# Patient Record
Sex: Male | Born: 1990 | Race: Black or African American | Hispanic: No | Marital: Single | Smoking: Current every day smoker
Health system: Southern US, Community
[De-identification: ages and names within clinical notes are randomized; demographics above are authoritative.]

---

## 2011-05-19 ENCOUNTER — Inpatient Hospital Stay (INDEPENDENT_AMBULATORY_CARE_PROVIDER_SITE_OTHER)
Admission: RE | Admit: 2011-05-19 | Discharge: 2011-05-19 | Disposition: A | Discharge status: Home / Self Care | Payer: PRIVATE HEALTH INSURANCE | Source: Ambulatory Visit | Attending: Family Medicine | Admitting: Family Medicine

## 2011-05-19 ENCOUNTER — Inpatient Hospital Stay (HOSPITAL_COMMUNITY)
Admission: RE | Admit: 2011-05-19 | Discharge: 2011-05-19 | Disposition: A | Discharge status: Home / Self Care | Payer: PRIVATE HEALTH INSURANCE | Source: Ambulatory Visit | Attending: Emergency Medicine | Admitting: Emergency Medicine

## 2011-05-19 DIAGNOSIS — K5289 Other specified noninfective gastroenteritis and colitis: Secondary | ICD-10-CM

## 2011-10-17 ENCOUNTER — Ambulatory Visit: Payer: PRIVATE HEALTH INSURANCE | Admitting: Family Medicine

## 2012-06-22 ENCOUNTER — Emergency Department: Payer: Self-pay | Admitting: Emergency Medicine

## 2012-08-15 ENCOUNTER — Emergency Department (HOSPITAL_COMMUNITY)
Admission: EM | Admit: 2012-08-15 | Discharge: 2012-08-15 | Disposition: A | Discharge status: Home / Self Care | Payer: BC Managed Care – HMO | Attending: Emergency Medicine | Admitting: Emergency Medicine

## 2012-08-15 ENCOUNTER — Encounter (HOSPITAL_COMMUNITY): Payer: Self-pay | Admitting: Nurse Practitioner

## 2012-08-15 ENCOUNTER — Emergency Department (HOSPITAL_COMMUNITY): Payer: BC Managed Care – HMO

## 2012-08-15 DIAGNOSIS — R0602 Shortness of breath: Secondary | ICD-10-CM | POA: Insufficient documentation

## 2012-08-15 DIAGNOSIS — R21 Rash and other nonspecific skin eruption: Secondary | ICD-10-CM

## 2012-08-15 DIAGNOSIS — R0789 Other chest pain: Secondary | ICD-10-CM

## 2012-08-15 LAB — CBC
MCV: 84.8 fL (ref 78.0–100.0)
Platelets: 231 10*3/uL (ref 150–400)
RDW: 12.8 % (ref 11.5–15.5)
WBC: 6 10*3/uL (ref 4.0–10.5)

## 2012-08-15 LAB — BASIC METABOLIC PANEL
Chloride: 105 mEq/L (ref 96–112)
Creatinine, Ser: 1.13 mg/dL (ref 0.50–1.35)
GFR calc Af Amer: >90 mL/min (ref >90)
Potassium: 4.1 mEq/L (ref 3.5–5.1)

## 2012-08-15 LAB — POCT I-STAT TROPONIN I: Troponin i, poc: 0.02 ng/mL (ref 0.00–0.08)

## 2012-08-15 MED ORDER — NAPROXEN 500 MG PO TABS: 500.0000 mg | ORAL_TABLET | Freq: Two times a day (BID) | ORAL | Status: DC

## 2012-08-15 MED ORDER — HYDROCORTISONE 1 % EX CREA: TOPICAL_CREAM | CUTANEOUS | Status: DC

## 2012-08-15 NOTE — ED Notes (Signed)
Pt c/o chest pain over past week, notices it comes when he is anxious or "overwhelmed." NO pain now. No cardiac history. Also concerned for itchy rash over new tattoo.

## 2012-08-15 NOTE — ED Provider Notes (Signed)
History     CSN: [id number]  Arrival date & time 08/15/12  1647   First MD Initiated Contact with Patient 08/15/12 2057      Chief Complaint  Patient presents with  . Chest Pain   HPI  History provided by the patient. Patient is a 21 year old male with no significant PMH who presents with episodic left sternal chest pains. Symptoms have present for the past one to 2 weeks. Patient states symptoms seem to for most often when he is anxious, arguing feeling uneasy. Symptoms are described as heart palpitations and fast heart rate with chest discomfort and pain. Patient also reports shortness of breath with hyperventilation. Occasionally he becomes slightly lightheaded with tingling in fingers. Denies any syncopal episodes. Symptoms do not occur during exertion or activity. Patient has not used any treatments for symptoms. Patient denies any recent fever, chills, cough, hemoptysis. Patient has no recent travel. Patient denies having similar symptoms previously. Patient also complains of a for dry scaly rash over right forearm. Rash is located in the area over a tattoo. Tattoo is not new. He denies any pain.    History reviewed. No pertinent past medical history.  History reviewed. No pertinent past surgical history.  History reviewed. No pertinent family history.  History  Substance Use Topics  . Smoking status: Never Smoker   . Smokeless tobacco: Not on file  . Alcohol Use: Yes      Review of Systems  Constitutional: Negative for fever and chills.  Respiratory: Positive for shortness of breath. Negative for cough.   Cardiovascular: Positive for chest pain and palpitations. Negative for leg swelling.  Gastrointestinal: Negative for nausea, vomiting, abdominal pain, diarrhea and constipation.  Skin: Positive for rash.  Psychiatric/Behavioral: Negative for suicidal ideas. The patient is nervous/anxious.     Allergies  Review of patient's allergies indicates no known  allergies.  Home Medications  No current outpatient prescriptions on file.  BP 143/62  Pulse 75  Temp 98.6 F (37 C) (Oral)  Resp 16  SpO2 98%  Physical Exam  Nursing note and vitals reviewed. Constitutional: He is oriented to person, place, and time. He appears well-developed and well-nourished. No distress.  HENT:  Head: Normocephalic.  Cardiovascular: Normal rate and regular rhythm.   No murmur heard. Pulmonary/Chest: Effort normal and breath sounds normal. No respiratory distress. He has no wheezes. He has no rales.       Mild/moderate reproducible left sternal chest wall tenderness. No deformities or crepitus.  Abdominal: Soft. There is no tenderness. There is no rebound and no guarding.  Neurological: He is alert and oriented to person, place, and time.  Skin: Rash noted.       Dry scaly plaque-like lesions limited to areas of ink from tattoo on right forearm. Rash does not appear on other areas of the skin nearby.  Psychiatric: He has a normal mood and affect. His behavior is normal.    ED Course  Procedures  Results for orders placed during the hospital encounter of 08/15/12  CBC      Component Value Range   WBC 6.0  4.0 - 10.5 K/uL   RBC 5.61  4.22 - 5.81 MIL/uL   Hemoglobin 16.7  13.0 - 17.0 g/dL   HCT 40.9  81.1 - 91.4 %   MCV 84.8  78.0 - 100.0 fL   MCH 29.8  26.0 - 34.0 pg   MCHC 35.1  30.0 - 36.0 g/dL   RDW 78.2  95.6 - 21.3 %  Platelets 231  150 - 400 K/uL  BASIC METABOLIC PANEL      Component Value Range   Sodium 138  135 - 145 mEq/L   Potassium 4.1  3.5 - 5.1 mEq/L   Chloride 105  96 - 112 mEq/L   CO2 25  19 - 32 mEq/L   Glucose, Bld 103 (*) 70 - 99 mg/dL   BUN 18  6 - 23 mg/dL   Creatinine, Ser 9.81  0.50 - 1.35 mg/dL   Calcium 9.4  8.4 - 19.1 mg/dL   GFR calc non Af Amer >90  >90 mL/min   GFR calc Af Amer >90  >90 mL/min  POCT I-STAT TROPONIN I      Component Value Range   Troponin i, poc 0.02  0.00 - 0.08 ng/mL   Comment 3                  Dg Chest 2 View  08/15/2012  *RADIOLOGY REPORT*  Clinical Data: Chest pain and shortness of breath.  CHEST - 2 VIEW  Comparison: None.  Findings: The heart size and vascularity are normal and the lungs are clear except for slight peribronchial thickening which can be seen with bronchitis and asthma.  No effusions.  No osseous abnormality.  IMPRESSION: Slight peribronchial thickening.   Original Report Authenticated By: Gwynn Burly, M.D.      1. Musculoskeletal chest pain   2. Rash       MDM  Patient seen and evaluated. Patient appears comfortable in no acute distress.  Lab tests, EKG and chest x-ray unremarkable. Symptoms atypical for any concerning or emergent cause of symptoms. At this time patient stable to be discharged home to followup with PCP.    Date: 08/15/2012  Rate: 89  Rhythm: normal sinus rhythm  QRS Axis: normal  Intervals: normal  ST/T Wave abnormalities: normal  Conduction Disutrbances:none  Narrative Interpretation:   Old EKG Reviewed: none available       Angus Seller, [place] 08/15/12 2207

## 2012-08-15 NOTE — ED Notes (Signed)
Spoke with Kathline Magic PA, okay with seeing pt in [facility]

## 2012-08-15 NOTE — ED Provider Notes (Signed)
Medical screening examination/treatment/procedure(s) were performed by non-physician practitioner and as supervising physician I was immediately available for consultation/collaboration.  Zein Helbing, MD 08/15/12 2316 

## 2012-08-16 ENCOUNTER — Encounter (HOSPITAL_COMMUNITY): Payer: Self-pay | Admitting: *Deleted

## 2012-08-16 ENCOUNTER — Emergency Department (HOSPITAL_COMMUNITY)
Admission: EM | Admit: 2012-08-16 | Discharge: 2012-08-17 | Disposition: A | Discharge status: Home / Self Care | Payer: BC Managed Care – HMO | Attending: Emergency Medicine | Admitting: Emergency Medicine

## 2012-08-16 DIAGNOSIS — R0789 Other chest pain: Secondary | ICD-10-CM | POA: Insufficient documentation

## 2012-08-16 DIAGNOSIS — T39314A Poisoning by propionic acid derivatives, undetermined, initial encounter: Secondary | ICD-10-CM | POA: Insufficient documentation

## 2012-08-16 DIAGNOSIS — T39311A Poisoning by propionic acid derivatives, accidental (unintentional), initial encounter: Secondary | ICD-10-CM

## 2012-08-16 DIAGNOSIS — T50992A Poisoning by other drugs, medicaments and biological substances, intentional self-harm, initial encounter: Secondary | ICD-10-CM | POA: Insufficient documentation

## 2012-08-16 MED ORDER — ACTIDOSE WITH SORBITOL 50 GM/240ML PO LIQD
50.0000 g | Freq: Once | ORAL | Status: AC
  Administered 2012-08-17: 50 g via ORAL
  Filled 2012-08-16: qty 240

## 2012-08-16 NOTE — ED Notes (Addendum)
Pt was arguing with his girlfriend and took a whole bottle of naproxen at 2300.  Pt appears angry and is not answering questions except to say that his stomach hurts.  Pt filled prescription for naproxen today and entire bottle (30 tabs) of 500 mg naproxen is gone.

## 2012-08-17 LAB — RAPID URINE DRUG SCREEN, HOSP PERFORMED
Amphetamines: NOT DETECTED
Cocaine: NOT DETECTED
Opiates: NOT DETECTED
Tetrahydrocannabinol: POSITIVE — AB

## 2012-08-17 LAB — COMPREHENSIVE METABOLIC PANEL
ALT: 25 U/L (ref 0–53)
AST: 25 U/L (ref 0–37)
Albumin: 4.4 g/dL (ref 3.5–5.2)
CO2: 27 mEq/L (ref 19–32)
Calcium: 9.3 mg/dL (ref 8.4–10.5)
GFR calc non Af Amer: 80 mL/min — ABNORMAL LOW (ref >90)
Sodium: 140 mEq/L (ref 135–145)
Total Protein: 7 g/dL (ref 6.0–8.3)

## 2012-08-17 LAB — CBC
MCH: 29.5 pg (ref 26.0–34.0)
Platelets: 212 10*3/uL (ref 150–400)
RBC: 5.33 MIL/uL (ref 4.22–5.81)

## 2012-08-17 LAB — SALICYLATE LEVEL: Salicylate Lvl: <2 mg/dL — ABNORMAL LOW (ref 2.8–20.0)

## 2012-08-17 MED ORDER — SODIUM CHLORIDE 0.9 % IV SOLN: 1000.0000 mL | INTRAVENOUS | Status: DC

## 2012-08-17 MED ORDER — SODIUM CHLORIDE 0.9 % IV SOLN
1000.0000 mL | Freq: Once | INTRAVENOUS | Status: AC
  Administered 2012-08-17: 1000 mL via INTRAVENOUS

## 2012-08-17 NOTE — ED Notes (Signed)
Pt ambulatory to bathroom

## 2012-08-17 NOTE — ED Provider Notes (Addendum)
History     CSN: [id number]  Arrival date & time 08/16/12  2326   First MD Initiated Contact with Patient 08/16/12 2329      Chief Complaint  Patient presents with  . Drug Overdose    (Consider location/radiation/quality/duration/timing/severity/associated sxs/prior treatment) HPI 21 yo male presents to the ER with reported overdose.  Pt seen in ED yesterday with chest pain, prescribed naprosyn, 30 tablets.  Pt had reported fight with girlfriend, took entire bottle around 11 pm.  Pt refusing to answer questions to triage, aside from c/o stomach pain.  He reports to me that he only took 6-9 tablets "because my chest hurt" and "spit out the rest when they all spilled in my mouth", but then reports the rest of the bottle spilled on the floor.  Bottle is empty.  No prior h/o psychiatric problems in medical record.  Pt denies co-ingestion.  Pt denies abd pain to me.  Pt reports he took more than prescribed dosage due to not reading the instructions and having "a lot of pain".  Pt with flat affect, will not look at me, and his story changes often on how many pills he took and what happened to 30 tabs.    History reviewed. No pertinent past medical history.  History reviewed. No pertinent past surgical history.  No family history on file.  History  Substance Use Topics  . Smoking status: Never Smoker   . Smokeless tobacco: Not on file  . Alcohol Use: Yes     occasionally      Review of Systems  Unable to perform ROS: Other  Pt refuses to answer ROS questions  Allergies  Review of patient's allergies indicates no known allergies.  Home Medications   Current Outpatient Rx  Name Route Sig Dispense Refill  . NAPROXEN 500 MG PO TABS Oral Take by mouth once.      BP 140/74  Pulse 106  Temp 99.2 F (37.3 C) (Oral)  Resp 20  SpO2 99%  Physical Exam  Nursing note and vitals reviewed. Constitutional: He is oriented to person, place, and time. He appears well-developed and  well-nourished.  HENT:  Head: Normocephalic and atraumatic.  Nose: Nose normal.  Mouth/Throat: Oropharynx is clear and moist.  Eyes: Conjunctivae normal and EOM are normal. Pupils are equal, round, and reactive to light.  Neck: Normal range of motion. Neck supple. No JVD present. No tracheal deviation present. No thyromegaly present.  Cardiovascular: Normal rate, regular rhythm, normal heart sounds and intact distal pulses.  Exam reveals no gallop and no friction rub.   No murmur heard. Pulmonary/Chest: Effort normal and breath sounds normal. No stridor. No respiratory distress. He has no wheezes. He has no rales. He exhibits no tenderness.  Abdominal: Soft. Bowel sounds are normal. He exhibits no distension and no mass. There is no tenderness. There is no rebound and no guarding.  Musculoskeletal: Normal range of motion. He exhibits no edema and no tenderness.  Lymphadenopathy:    He has no cervical adenopathy.  Neurological: He is alert and oriented to person, place, and time. He exhibits normal muscle tone. Coordination normal.  Skin: Skin is warm and dry. No rash noted. No erythema. No pallor.  Psychiatric:       Flat affect, angry, hostile    ED Course  Procedures (including critical care time)   Labs Reviewed  CBC  COMPREHENSIVE METABOLIC PANEL  ETHANOL  ACETAMINOPHEN LEVEL  SALICYLATE LEVEL  URINE RAPID DRUG SCREEN (HOSP PERFORMED)  Dg Chest 2 View  08/15/2012  *RADIOLOGY REPORT*  Clinical Data: Chest pain and shortness of breath.  CHEST - 2 VIEW  Comparison: None.  Findings: The heart size and vascularity are normal and the lungs are clear except for slight peribronchial thickening which can be seen with bronchitis and asthma.  No effusions.  No osseous abnormality.  IMPRESSION: Slight peribronchial thickening.   Original Report Authenticated By: Gwynn Burly, M.D.     Date: 08/17/2012  Rate: 90  Rhythm: normal sinus rhythm  QRS Axis: normal  Intervals: normal   ST/T Wave abnormalities: normal  Conduction Disutrbances:none  Narrative Interpretation:   Old EKG Reviewed: unchanged     1. Naproxen overdose       MDM  21 year old male with reported overdose of 30 tablets of Naprosyn. Patient has alternating story of how money he took and why he took them, and is not reliable historian. Concern for possible suicide attempt or suicide gesture given context of having a fight with his girlfriend. Will have him see by telepsych, will treat for possible coingestions with charcoal, will check EKG and baseline labs.   4:46 AM Dr Jacky Kindle with telepsych has seen, does not feel patient requires inpatient admission, does not feel he is at risk for further self harm.  Will d/c home to f/u with pcm for bun/Cr recheck in 3-5 days and mental health forms.        Olivia Mackie, MD 08/17/12 1610  Olivia Mackie, MD 08/17/12 [phone]

## 2012-08-24 ENCOUNTER — Telehealth: Payer: Self-pay | Admitting: Family Medicine

## 2012-08-24 DIAGNOSIS — T39391A Poisoning by other nonsteroidal anti-inflammatory drugs [NSAID], accidental (unintentional), initial encounter: Secondary | ICD-10-CM

## 2012-08-24 NOTE — Telephone Encounter (Signed)
Message left for patient to return my call.  

## 2012-08-24 NOTE — Telephone Encounter (Signed)
Pt seen at ER 08/16/2012 with NSAID overdose.  He needs f/u for this.  Either with [place] or with [facility] for lab check.   If doesn't go to [facility], please have him come in for blood work and then afterwards for office visit to establish w/ me. He is not a patient of mine but we need to ensure we've recommended f/u for his kidney health.

## 2012-08-25 NOTE — Telephone Encounter (Signed)
Message left for patient to return my call.  

## 2012-08-26 NOTE — Telephone Encounter (Signed)
Patient did not return my call. Letter mailed.

## 2013-02-07 ENCOUNTER — Emergency Department: Payer: Self-pay | Admitting: Emergency Medicine

## 2013-02-07 LAB — URINALYSIS, COMPLETE
Bilirubin,UR: NEGATIVE
Blood: NEGATIVE
Glucose,UR: NEGATIVE mg/dL (ref 0–75)
Ketone: NEGATIVE
Leukocyte Esterase: NEGATIVE
Ph: 7 (ref 4.5–8.0)
RBC,UR: NONE SEEN /HPF (ref 0–5)
Squamous Epithelial: NONE SEEN
WBC UR: NONE SEEN /HPF (ref 0–5)

## 2013-02-07 LAB — CBC
HCT: 45.2 % (ref 40.0–52.0)
MCV: 87 fL (ref 80–100)
Platelet: 208 10*3/uL (ref 150–440)
RBC: 5.17 10*6/uL (ref 4.40–5.90)
RDW: 13.2 % (ref 11.5–14.5)

## 2013-02-07 LAB — COMPREHENSIVE METABOLIC PANEL
Alkaline Phosphatase: 84 U/L (ref 50–136)
Anion Gap: 5 — ABNORMAL LOW (ref 7–16)
BUN: 17 mg/dL (ref 7–18)
Co2: 26 mmol/L (ref 21–32)
EGFR (African American): >60
EGFR (Non-African Amer.): >60
Osmolality: 287 (ref 275–301)
Potassium: 3.8 mmol/L (ref 3.5–5.1)
SGOT(AST): 44 U/L — ABNORMAL HIGH (ref 15–37)
Total Protein: 7.1 g/dL (ref 6.4–8.2)

## 2013-04-23 ENCOUNTER — Emergency Department: Payer: Self-pay | Admitting: Emergency Medicine

## 2013-04-23 LAB — COMPREHENSIVE METABOLIC PANEL
Anion Gap: 7 (ref 7–16)
Bilirubin,Total: 0.6 mg/dL (ref 0.2–1.0)
Chloride: 110 mmol/L — ABNORMAL HIGH (ref 98–107)
Co2: 26 mmol/L (ref 21–32)
EGFR (African American): >60
EGFR (Non-African Amer.): >60
SGPT (ALT): 35 U/L (ref 12–78)
Sodium: 143 mmol/L (ref 136–145)

## 2013-04-23 LAB — CBC
HCT: 47.2 % (ref 40.0–52.0)
HGB: 16.4 g/dL (ref 13.0–18.0)
MCH: 29.7 pg (ref 26.0–34.0)
MCHC: 34.6 g/dL (ref 32.0–36.0)

## 2013-04-23 LAB — URINALYSIS, COMPLETE
Bacteria: NONE SEEN
Bilirubin,UR: NEGATIVE
Blood: NEGATIVE
Leukocyte Esterase: NEGATIVE
Nitrite: NEGATIVE
Protein: 75
Specific Gravity: 1 (ref 1.003–1.030)
WBC UR: 4 /HPF (ref 0–5)

## 2013-11-07 IMAGING — CR DG CHEST 2V
2 series · 2 of 2 positions shown · non-contrast
Comparison: None.

CLINICAL DATA: Chest pain and shortness of breath.

CHEST - 2 VIEW

[w chest pa]
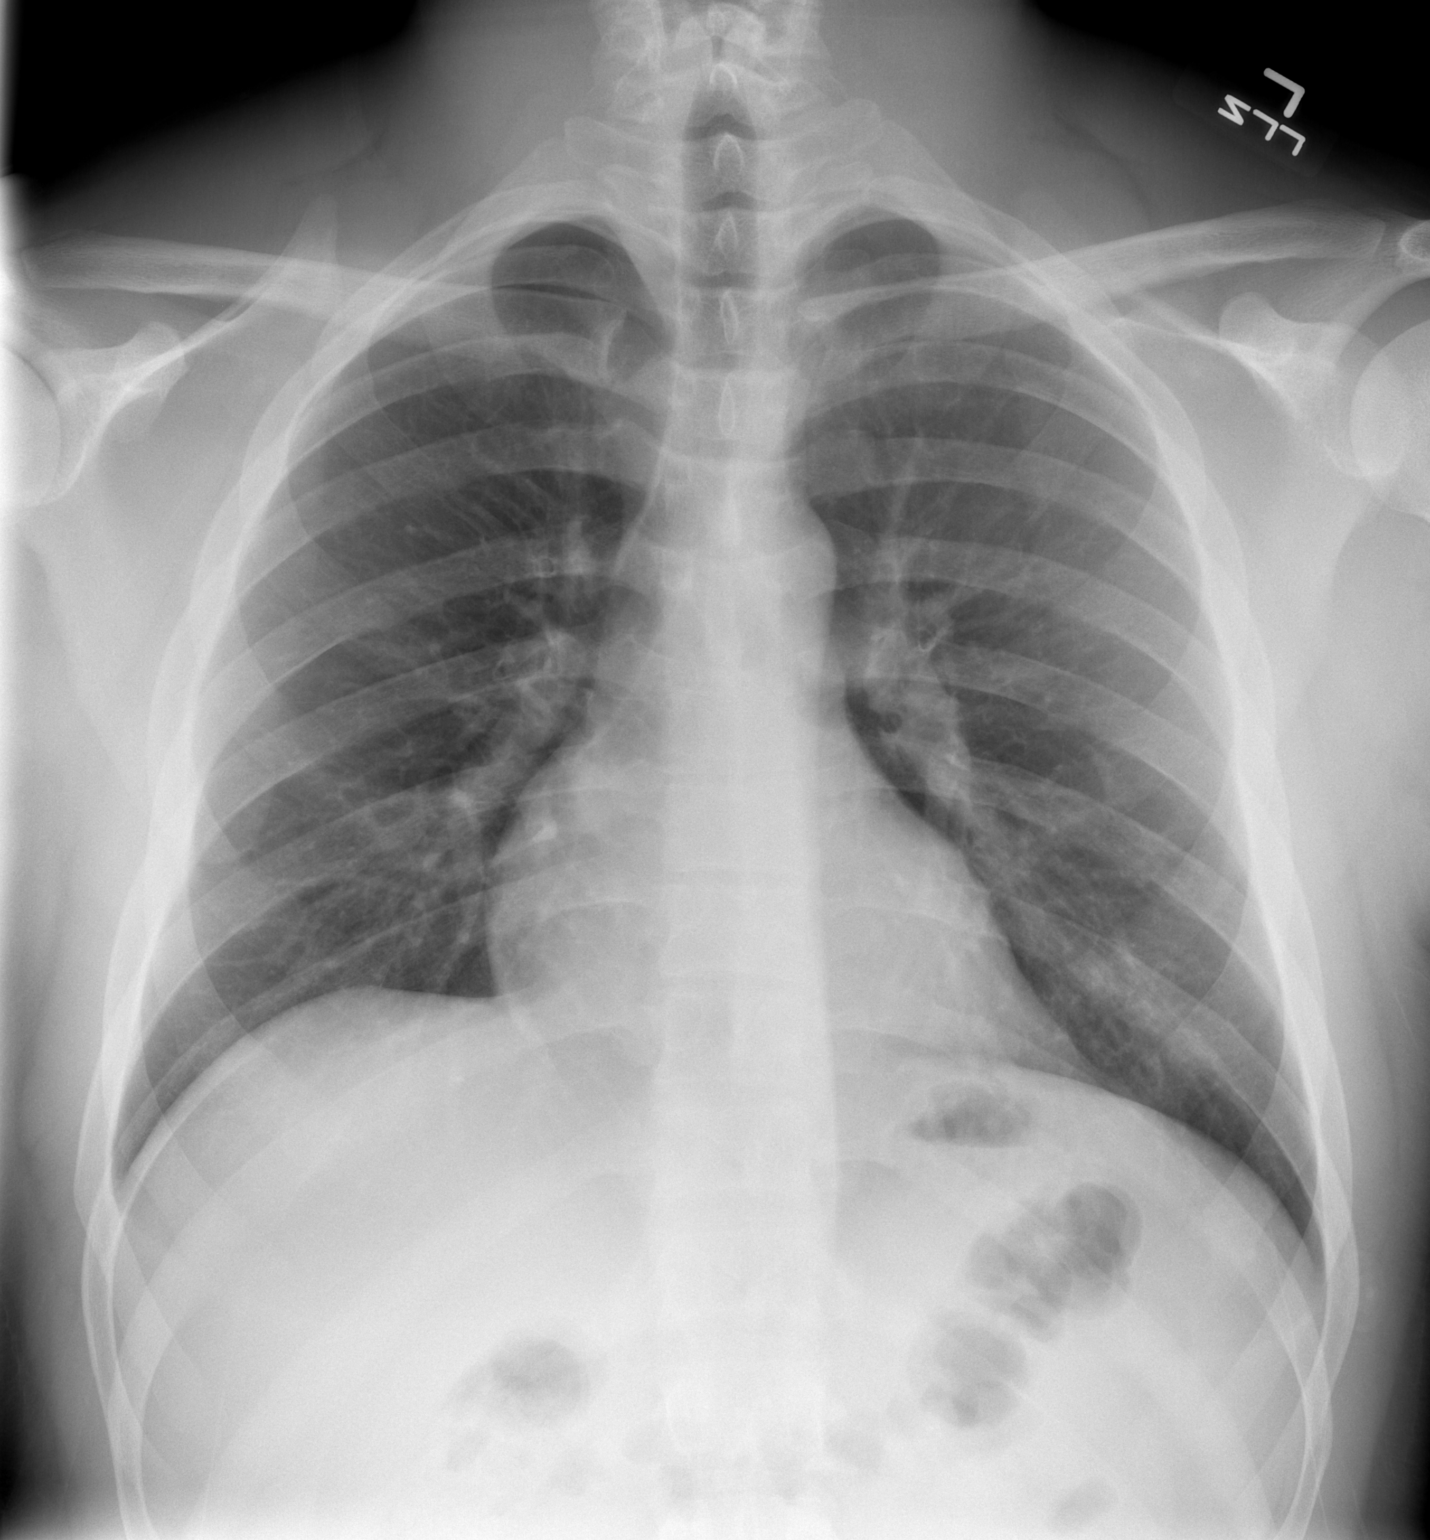

[w chest lat]
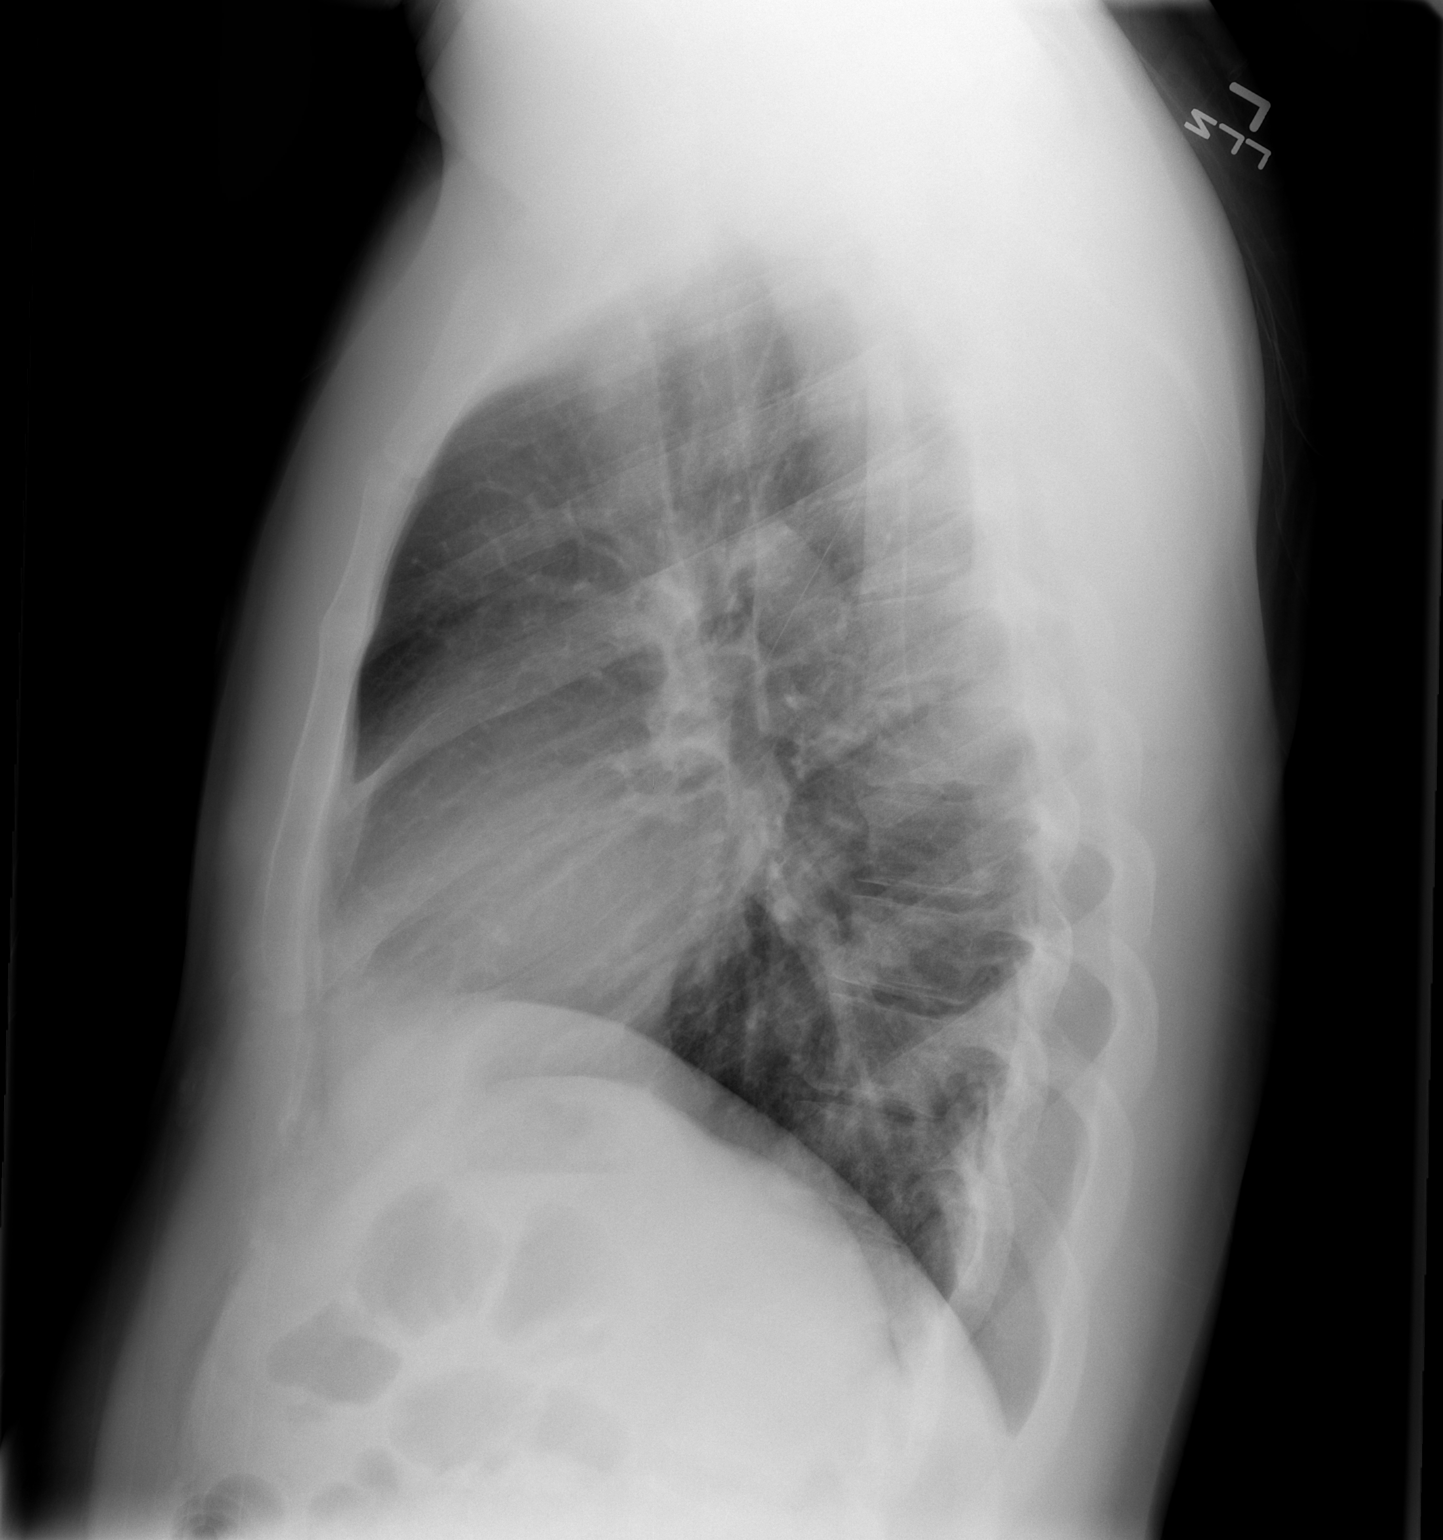

[2 of 2 positions shown; findings below may reference images not displayed]

FINDINGS: The heart size and vascularity are normal and the lungs
are clear except for slight peribronchial thickening which can be
seen with bronchitis and asthma.  No effusions.  No osseous
abnormality.
IMPRESSION: Slight peribronchial thickening.

## 2014-05-08 ENCOUNTER — Emergency Department: Payer: Self-pay | Admitting: Emergency Medicine

## 2014-08-06 ENCOUNTER — Emergency Department (HOSPITAL_BASED_OUTPATIENT_CLINIC_OR_DEPARTMENT_OTHER)
Admission: EM | Admit: 2014-08-06 | Discharge: 2014-08-06 | Disposition: A | Discharge status: Home / Self Care | Payer: BC Managed Care – HMO | Attending: Emergency Medicine | Admitting: Emergency Medicine

## 2014-08-06 ENCOUNTER — Encounter (HOSPITAL_BASED_OUTPATIENT_CLINIC_OR_DEPARTMENT_OTHER): Payer: Self-pay | Admitting: Emergency Medicine

## 2014-08-06 DIAGNOSIS — R197 Diarrhea, unspecified: Secondary | ICD-10-CM | POA: Diagnosis not present

## 2014-08-06 DIAGNOSIS — Z77120 Contact with and (suspected) exposure to mold (toxic): Secondary | ICD-10-CM

## 2014-08-06 DIAGNOSIS — R112 Nausea with vomiting, unspecified: Secondary | ICD-10-CM

## 2014-08-06 MED ORDER — BENZONATATE 100 MG PO CAPS
200.0000 mg | ORAL_CAPSULE | Freq: Three times a day (TID) | ORAL | Status: DC | PRN
Start: 2014-08-06 — End: 2014-08-06
  Administered 2014-08-06: 200 mg via ORAL
  Filled 2014-08-06: qty 2

## 2014-08-06 MED ORDER — ONDANSETRON HCL 4 MG PO TABS: 4.0000 mg | ORAL_TABLET | Freq: Three times a day (TID) | ORAL | Status: AC | PRN

## 2014-08-06 MED ORDER — BENZONATATE 200 MG PO CAPS: 200.0000 mg | ORAL_CAPSULE | Freq: Three times a day (TID) | ORAL | Status: AC | PRN

## 2014-08-06 NOTE — ED Provider Notes (Signed)
CSN: [id number]     Arrival date & time 08/06/14  0003 History   First MD Initiated Contact with Patient 08/06/14 0119     Chief Complaint  Patient presents with  . Emesis     (Consider location/radiation/quality/duration/timing/severity/associated sxs/prior Treatment) HPI 23 yo male presents to the ER from home with complaint of n/v/d intermittently for 1 week.  Pt's girlfriend and two children have had cough and n/v/d as well.  Mold noted in apt today, is concerned is cause for symptoms.  Pt reports mold is black, around floorboards and edges of room.  He has not disturbed the mold.  No vomiting or diarrhea in the last 8 hours.  Pt eating/drinking ok.  He reports cough, none currently History reviewed. No pertinent past medical history. History reviewed. No pertinent past surgical history. No family history on file. History  Substance Use Topics  . Smoking status: Never Smoker   . Smokeless tobacco: Not on file  . Alcohol Use: Yes     Comment: occasionally    Review of Systems  All other systems reviewed and are negative.     Allergies  Review of patient's allergies indicates no known allergies.  Home Medications   Prior to Admission medications   Medication Sig Start Date End Date Taking? Authorizing Provider  benzonatate (TESSALON) 200 MG capsule Take 1 capsule (200 mg total) by mouth 3 (three) times daily as needed for cough. 08/06/14   Olivia Mackielga M Johnothan Bascomb, MD  ondansetron (ZOFRAN) 4 MG tablet Take 1 tablet (4 mg total) by mouth every 8 (eight) hours as needed for nausea or vomiting. 08/06/14   Olivia Mackielga M Davonte Siebenaler, MD   BP 134/86  Pulse 87  Temp(Src) 98 F (36.7 C) (Oral)  Resp 18  Wt 255 lb (115.667 kg)  SpO2 98% Physical Exam  Nursing note and vitals reviewed. Constitutional: He is oriented to person, place, and time. He appears well-developed and well-nourished.  HENT:  Head: Normocephalic and atraumatic.  Nose: Nose normal.  Mouth/Throat: Oropharynx is clear and moist.   Eyes: Conjunctivae and EOM are normal. Pupils are equal, round, and reactive to light.  Neck: Normal range of motion. Neck supple. No JVD present. No tracheal deviation present. No thyromegaly present.  Cardiovascular: Normal rate, regular rhythm, normal heart sounds and intact distal pulses.  Exam reveals no gallop and no friction rub.   No murmur heard. Pulmonary/Chest: Effort normal and breath sounds normal. No stridor. No respiratory distress. He has no wheezes. He has no rales. He exhibits no tenderness.  Abdominal: Soft. Bowel sounds are normal. He exhibits no distension and no mass. There is no tenderness. There is no rebound and no guarding.  Musculoskeletal: Normal range of motion. He exhibits no edema and no tenderness.  Lymphadenopathy:    He has no cervical adenopathy.  Neurological: He is alert and oriented to person, place, and time. He displays normal reflexes. He exhibits normal muscle tone. Coordination normal.  Skin: Skin is warm and dry. No rash noted. No erythema. No pallor.  Psychiatric: He has a normal mood and affect. His behavior is normal. Judgment and thought content normal.    ED Course  Procedures (including critical care time) Labs Review Labs Reviewed - No data to display  Imaging Review No results found.   EKG Interpretation None      MDM   Final diagnoses:  Nausea vomiting and diarrhea  Suspected exposure to mold    23 yo male, well appearing, with intermittent n/v/d  over the last week, contacts with same, also with possible mold exposure.  Plan to treat with zofran.  Per report, he is already out of the mold environment.   Pt c/o severe sore throat with coughing to nursing staff.  No coughing during my evaluation of him, his daughter and his girlfriend.  Will prescribe tessalon.    Olivia Mackielga M Mialee Weyman, MD 08/06/14 [phone]

## 2014-08-06 NOTE — ED Notes (Signed)
Patient here with intermittent vomiting x 1 week, reports that he just found mold in apartment and thinks illness related to same.

## 2014-08-06 NOTE — Discharge Instructions (Signed)
Food Choices to Help Relieve Diarrhea °When you have diarrhea, the foods you eat and your eating habits are very important. Choosing the right foods and drinks can help relieve diarrhea. Also, because diarrhea can last up to 7 days, you need to replace lost fluids and electrolytes (such as sodium, potassium, and chloride) in order to help prevent dehydration.  °WHAT GENERAL GUIDELINES DO I NEED TO FOLLOW? °· Slowly drink 1 cup (8 oz) of fluid for each episode of diarrhea. If you are getting enough fluid, your urine will be clear or pale yellow. °· Eat starchy foods. Some good choices include white rice, white toast, pasta, low-fiber cereal, baked potatoes (without the skin), saltine crackers, and bagels. °· Avoid large servings of any cooked vegetables. °· Limit fruit to two servings per day. A serving is ½ cup or 1 small piece. °· Choose foods with less than 2 g of fiber per serving. °· Limit fats to less than 8 tsp (38 g) per day. °· Avoid fried foods. °· Eat foods that have probiotics in them. Probiotics can be found in certain dairy products. °· Avoid foods and beverages that may increase the speed at which food moves through the stomach and intestines (gastrointestinal tract). Things to avoid include: °¨ High-fiber foods, such as dried fruit, raw fruits and vegetables, nuts, seeds, and whole grain foods. °¨ Spicy foods and high-fat foods. °¨ Foods and beverages sweetened with high-fructose corn syrup, honey, or sugar alcohols such as xylitol, sorbitol, and mannitol. °WHAT FOODS ARE RECOMMENDED? °Grains °White rice. White, French, or pita breads (fresh or toasted), including plain rolls, buns, or bagels. White pasta. Saltine, soda, or graham crackers. Pretzels. Low-fiber cereal. Cooked cereals made with water (such as cornmeal, farina, or cream cereals). Plain muffins. Matzo. Melba toast. Zwieback.  °Vegetables °Potatoes (without the skin). Strained tomato and vegetable juices. Most well-cooked and canned  vegetables without seeds. Tender lettuce. °Fruits °Cooked or canned applesauce, apricots, cherries, fruit cocktail, grapefruit, peaches, pears, or plums. Fresh bananas, apples without skin, cherries, grapes, cantaloupe, grapefruit, peaches, oranges, or plums.  °Meat and Other Protein Products °Baked or boiled chicken. Eggs. Tofu. Fish. Seafood. Smooth peanut butter. Ground or well-cooked tender beef, ham, veal, lamb, pork, or poultry.  °Dairy °Plain yogurt, kefir, and unsweetened liquid yogurt. Lactose-free milk, buttermilk, or soy milk. Plain hard cheese. °Beverages °Sport drinks. Clear broths. Diluted fruit juices (except prune). Regular, caffeine-free sodas such as ginger ale. Water. Decaffeinated teas. Oral rehydration solutions. Sugar-free beverages not sweetened with sugar alcohols. °Other °Bouillon, broth, or soups made from recommended foods.  °The items listed above may not be a complete list of recommended foods or beverages. Contact your dietitian for more options. °WHAT FOODS ARE NOT RECOMMENDED? °Grains °Whole grain, whole wheat, bran, or rye breads, rolls, pastas, crackers, and cereals. Wild or brown rice. Cereals that contain more than 2 g of fiber per serving. Corn tortillas or taco shells. Cooked or dry oatmeal. Granola. Popcorn. °Vegetables °Raw vegetables. Cabbage, broccoli, Brussels sprouts, artichokes, baked beans, beet greens, corn, kale, legumes, peas, sweet potatoes, and yams. Potato skins. Cooked spinach and cabbage. °Fruits °Dried fruit, including raisins and dates. Raw fruits. Stewed or dried prunes. Fresh apples with skin, apricots, mangoes, pears, raspberries, and strawberries.  °Meat and Other Protein Products °Chunky peanut butter. Nuts and seeds. Beans and lentils. Bacon.  °Dairy °High-fat cheeses. Milk, chocolate milk, and beverages made with milk, such as milk shakes. Cream. Ice cream. °Sweets and Desserts °Sweet rolls, doughnuts, and sweet breads. Pancakes   and waffles. °Fats and  Oils °Butter. Cream sauces. Margarine. Salad oils. Plain salad dressings. Olives. Avocados.  °Beverages °Caffeinated beverages (such as coffee, tea, soda, or energy drinks). Alcoholic beverages. Fruit juices with pulp. Prune juice. Soft drinks sweetened with high-fructose corn syrup or sugar alcohols. °Other °Coconut. Hot sauce. Chili powder. Mayonnaise. Gravy. Cream-based or milk-based soups.  °The items listed above may not be a complete list of foods and beverages to avoid. Contact your dietitian for more information. °WHAT SHOULD I DO IF I BECOME DEHYDRATED? °Diarrhea can sometimes lead to dehydration. Signs of dehydration include dark urine and dry mouth and skin. If you think you are dehydrated, you should rehydrate with an oral rehydration solution. These solutions can be purchased at pharmacies, retail stores, or online.  °Drink ½-1 cup (120-240 mL) of oral rehydration solution each time you have an episode of diarrhea. If drinking this amount makes your diarrhea worse, try drinking smaller amounts more often. For example, drink 1-3 tsp (5-15 mL) every 5-10 minutes.  °A general rule for staying hydrated is to drink 1½-2 L of fluid per day. Talk to your health care provider about the specific amount you should be drinking each day. Drink enough fluids to keep your urine clear or pale yellow. °Document Released: 01/03/2004 Document Revised: 10/18/2013 Document Reviewed: 09/05/2013 °ExitCare® Patient Information ©2015 [employer]. This information is not intended to replace advice given to you by your health care provider. Make sure you discuss any questions you have with your health care provider. ° °Nausea and Vomiting °Nausea is a sick feeling that often comes before throwing up (vomiting). Vomiting is a reflex where stomach contents come out of your mouth. Vomiting can cause severe loss of body fluids (dehydration). Children and elderly adults can become dehydrated quickly, especially if they also have  diarrhea. Nausea and vomiting are symptoms of a condition or disease. It is important to find the cause of your symptoms. °CAUSES  °· Direct irritation of the stomach lining. This irritation can result from increased acid production (gastroesophageal reflux disease), infection, food poisoning, taking certain medicines (such as nonsteroidal anti-inflammatory drugs), alcohol use, or tobacco use. °· Signals from the brain. These signals could be caused by a headache, heat exposure, an inner ear disturbance, increased pressure in the brain from injury, infection, a tumor, or a concussion, pain, emotional stimulus, or metabolic problems. °· An obstruction in the gastrointestinal tract (bowel obstruction). °· Illnesses such as diabetes, hepatitis, gallbladder problems, appendicitis, kidney problems, cancer, sepsis, atypical symptoms of a heart attack, or eating disorders. °· Medical treatments such as chemotherapy and radiation. °· Receiving medicine that makes you sleep (general anesthetic) during surgery. °DIAGNOSIS °Your caregiver may ask for tests to be done if the problems do not improve after a few days. Tests may also be done if symptoms are severe or if the reason for the nausea and vomiting is not clear. Tests may include: °· Urine tests. °· Blood tests. °· Stool tests. °· Cultures (to look for evidence of infection). °· X-rays or other imaging studies. °Test results can help your caregiver make decisions about treatment or the need for additional tests. °TREATMENT °You need to stay well hydrated. Drink frequently but in small amounts. You may wish to drink water, sports drinks, clear broth, or eat frozen ice pops or gelatin dessert to help stay hydrated. When you eat, eating slowly may help prevent nausea. There are also some antinausea medicines that may help prevent nausea. °HOME CARE INSTRUCTIONS  °· Take all medicine as   directed by your caregiver.  If you do not have an appetite, do not force yourself to  eat. However, you must continue to drink fluids.  If you have an appetite, eat a normal diet unless your caregiver tells you differently.  Eat a variety of complex carbohydrates (rice, wheat, potatoes, bread), lean meats, yogurt, fruits, and vegetables.  Avoid high-fat foods because they are more difficult to digest.  Drink enough water and fluids to keep your urine clear or pale yellow.  If you are dehydrated, ask your caregiver for specific rehydration instructions. Signs of dehydration may include:  Severe thirst.  Dry lips and mouth.  Dizziness.  Dark urine.  Decreasing urine frequency and amount.  Confusion.  Rapid breathing or pulse. SEEK IMMEDIATE MEDICAL CARE IF:   You have blood or brown flecks (like coffee grounds) in your vomit.  You have black or bloody stools.  You have a severe headache or stiff neck.  You are confused.  You have severe abdominal pain.  You have chest pain or trouble breathing.  You do not urinate at least once every 8 hours.  You develop cold or clammy skin.  You continue to vomit for longer than 24 to 48 hours.  You have a fever. MAKE SURE YOU:   Understand these instructions.  Will watch your condition.  Will get help right away if you are not doing well or get worse. Document Released: 10/13/2005 Document Revised: 01/05/2012 Document Reviewed: 03/12/2011 [facility] Patient Information 2015 [place], [place]. This information is not intended to replace advice given to you by your health care provider. Make sure you discuss any questions you have with your health care provider.  Viral Infections A viral infection can be caused by different types of viruses.Most viral infections are not serious and resolve on their own. However, some infections may cause severe symptoms and may lead to further complications. SYMPTOMS Viruses can frequently cause:  Minor sore throat.  Aches and pains.  Headaches.  Runny nose.  Different  types of rashes.  Watery eyes.  Tiredness.  Cough.  Loss of appetite.  Gastrointestinal infections, resulting in nausea, vomiting, and diarrhea. These symptoms do not respond to antibiotics because the infection is not caused by bacteria. However, you might catch a bacterial infection following the viral infection. This is sometimes called a "superinfection." Symptoms of such a bacterial infection may include:  Worsening sore throat with pus and difficulty swallowing.  Swollen neck glands.  Chills and a high or persistent fever.  Severe headache.  Tenderness over the sinuses.  Persistent overall ill feeling (malaise), muscle aches, and tiredness (fatigue).  Persistent cough.  Yellow, green, or brown mucus production with coughing. HOME CARE INSTRUCTIONS   Only take over-the-counter or prescription medicines for pain, discomfort, diarrhea, or fever as directed by your caregiver.  Drink enough water and fluids to keep your urine clear or pale yellow. Sports drinks can provide valuable electrolytes, sugars, and hydration.  Get plenty of rest and maintain proper nutrition. Soups and broths with crackers or rice are fine. SEEK IMMEDIATE MEDICAL CARE IF:   You have severe headaches, shortness of breath, chest pain, neck pain, or an unusual rash.  You have uncontrolled vomiting, diarrhea, or you are unable to keep down fluids.  You or your child has an oral temperature above 102 F (38.9 C), not controlled by medicine.  Your baby is older than 3 months with a rectal temperature of 102 F (38.9 C) or higher.  Your baby is  3 months old or younger with a rectal temperature of 100.4 F (38 C) or higher. MAKE SURE YOU:   Understand these instructions.  Will watch your condition.  Will get help right away if you are not doing well or get worse. Document Released: 07/23/2005 Document Revised: 01/05/2012 Document Reviewed: 02/17/2011 [facility]Exit[facility] Patient Information 2015  [place]Care, [place]. This information is not intended to replace advice given to you by your health care provider. Make sure you discuss any questions you have with your health care provider.

## 2014-12-05 ENCOUNTER — Emergency Department (HOSPITAL_COMMUNITY): Payer: PRIVATE HEALTH INSURANCE

## 2014-12-05 ENCOUNTER — Encounter (HOSPITAL_COMMUNITY): Payer: Self-pay | Admitting: Emergency Medicine

## 2014-12-05 ENCOUNTER — Emergency Department (HOSPITAL_COMMUNITY)
Admission: EM | Admit: 2014-12-05 | Discharge: 2014-12-05 | Disposition: A | Discharge status: Home / Self Care | Payer: PRIVATE HEALTH INSURANCE | Attending: Emergency Medicine | Admitting: Emergency Medicine

## 2014-12-05 DIAGNOSIS — Y998 Other external cause status: Secondary | ICD-10-CM | POA: Insufficient documentation

## 2014-12-05 DIAGNOSIS — S161XXA Strain of muscle, fascia and tendon at neck level, initial encounter: Secondary | ICD-10-CM | POA: Insufficient documentation

## 2014-12-05 DIAGNOSIS — Z720 Tobacco use: Secondary | ICD-10-CM | POA: Insufficient documentation

## 2014-12-05 DIAGNOSIS — Z79899 Other long term (current) drug therapy: Secondary | ICD-10-CM | POA: Insufficient documentation

## 2014-12-05 DIAGNOSIS — M25561 Pain in right knee: Secondary | ICD-10-CM

## 2014-12-05 DIAGNOSIS — M25571 Pain in right ankle and joints of right foot: Secondary | ICD-10-CM

## 2014-12-05 DIAGNOSIS — S99911A Unspecified injury of right ankle, initial encounter: Secondary | ICD-10-CM | POA: Insufficient documentation

## 2014-12-05 DIAGNOSIS — S8991XA Unspecified injury of right lower leg, initial encounter: Secondary | ICD-10-CM | POA: Insufficient documentation

## 2014-12-05 DIAGNOSIS — Y92410 Unspecified street and highway as the place of occurrence of the external cause: Secondary | ICD-10-CM | POA: Insufficient documentation

## 2014-12-05 DIAGNOSIS — Y9389 Activity, other specified: Secondary | ICD-10-CM | POA: Insufficient documentation

## 2014-12-05 MED ORDER — IBUPROFEN 800 MG PO TABS
800.0000 mg | ORAL_TABLET | Freq: Once | ORAL | Status: AC
  Administered 2014-12-05: 800 mg via ORAL
  Filled 2014-12-05: qty 1

## 2014-12-05 MED ORDER — IBUPROFEN 800 MG PO TABS: 800.0000 mg | ORAL_TABLET | Freq: Three times a day (TID) | ORAL | Status: AC

## 2014-12-05 MED ORDER — DIAZEPAM 5 MG PO TABS
5.0000 mg | ORAL_TABLET | Freq: Once | ORAL | Status: AC
  Administered 2014-12-05: 5 mg via ORAL
  Filled 2014-12-05: qty 1

## 2014-12-05 MED ORDER — HYDROCODONE-ACETAMINOPHEN 5-325 MG PO TABS: 2.0000 | ORAL_TABLET | Freq: Once | ORAL | Status: DC
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
## 2015-07-28 DEATH — deceased
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
